# Patient Record
Sex: Male | Born: 1955 | Hispanic: Yes | Marital: Married | Smoking: Never smoker
Health system: Southern US, Community
[De-identification: ages and names within clinical notes are randomized; demographics above are authoritative.]

## PROBLEM LIST (undated history)

## (undated) DIAGNOSIS — K219 Gastro-esophageal reflux disease without esophagitis: Secondary | ICD-10-CM

---

## 2009-03-27 ENCOUNTER — Ambulatory Visit: Payer: Self-pay

## 2009-03-29 ENCOUNTER — Ambulatory Visit: Payer: Self-pay

## 2009-05-18 ENCOUNTER — Encounter: Admission: RE | Admit: 2009-05-18 | Discharge: 2009-05-18 | Payer: Self-pay | Admitting: Surgery

## 2009-05-22 ENCOUNTER — Other Ambulatory Visit: Admission: RE | Admit: 2009-05-22 | Discharge: 2009-05-22 | Payer: Self-pay | Admitting: Otolaryngology

## 2009-06-15 ENCOUNTER — Ambulatory Visit (HOSPITAL_BASED_OUTPATIENT_CLINIC_OR_DEPARTMENT_OTHER): Admission: RE | Admit: 2009-06-15 | Discharge: 2009-06-15 | Payer: Self-pay | Admitting: Otolaryngology

## 2009-06-15 ENCOUNTER — Encounter (INDEPENDENT_AMBULATORY_CARE_PROVIDER_SITE_OTHER): Payer: Self-pay | Admitting: Otolaryngology

## 2010-12-05 LAB — FUNGUS CULTURE W SMEAR

## 2010-12-05 LAB — AFB CULTURE WITH SMEAR (NOT AT ARMC)

## 2010-12-05 LAB — TISSUE CULTURE: Culture: NO GROWTH

## 2018-04-06 ENCOUNTER — Emergency Department (HOSPITAL_COMMUNITY): Payer: PRIVATE HEALTH INSURANCE

## 2018-04-06 ENCOUNTER — Emergency Department (HOSPITAL_COMMUNITY): Payer: PRIVATE HEALTH INSURANCE | Admitting: Anesthesiology

## 2018-04-06 ENCOUNTER — Encounter (HOSPITAL_COMMUNITY)
Admission: EM | Disposition: A | Discharge status: Home / Self Care | Payer: Self-pay | Source: Home / Self Care | Attending: Emergency Medicine

## 2018-04-06 ENCOUNTER — Ambulatory Visit (HOSPITAL_COMMUNITY)
Admission: EM | Admit: 2018-04-06 | Discharge: 2018-04-06 | Disposition: A | Discharge status: Home / Self Care | Payer: PRIVATE HEALTH INSURANCE | Attending: Emergency Medicine | Admitting: Emergency Medicine

## 2018-04-06 ENCOUNTER — Other Ambulatory Visit: Payer: Self-pay

## 2018-04-06 ENCOUNTER — Encounter (HOSPITAL_COMMUNITY): Payer: Self-pay | Admitting: Emergency Medicine

## 2018-04-06 DIAGNOSIS — Y990 Civilian activity done for income or pay: Secondary | ICD-10-CM | POA: Diagnosis not present

## 2018-04-06 DIAGNOSIS — S61301A Unspecified open wound of left index finger with damage to nail, initial encounter: Secondary | ICD-10-CM | POA: Diagnosis present

## 2018-04-06 DIAGNOSIS — Y93D2 Activity, sewing: Secondary | ICD-10-CM | POA: Insufficient documentation

## 2018-04-06 DIAGNOSIS — S62631B Displaced fracture of distal phalanx of left index finger, initial encounter for open fracture: Secondary | ICD-10-CM | POA: Insufficient documentation

## 2018-04-06 DIAGNOSIS — W3189XA Contact with other specified machinery, initial encounter: Secondary | ICD-10-CM | POA: Insufficient documentation

## 2018-04-06 DIAGNOSIS — S61313A Laceration without foreign body of left middle finger with damage to nail, initial encounter: Secondary | ICD-10-CM | POA: Diagnosis not present

## 2018-04-06 DIAGNOSIS — K219 Gastro-esophageal reflux disease without esophagitis: Secondary | ICD-10-CM | POA: Insufficient documentation

## 2018-04-06 DIAGNOSIS — Y9289 Other specified places as the place of occurrence of the external cause: Secondary | ICD-10-CM | POA: Diagnosis not present

## 2018-04-06 DIAGNOSIS — S62633B Displaced fracture of distal phalanx of left middle finger, initial encounter for open fracture: Secondary | ICD-10-CM

## 2018-04-06 HISTORY — PX: I & D EXTREMITY: SHX5045

## 2018-04-06 HISTORY — DX: Gastro-esophageal reflux disease without esophagitis: K21.9

## 2018-04-06 SURGERY — IRRIGATION AND DEBRIDEMENT EXTREMITY
Anesthesia: General | Site: Hand | Laterality: Left

## 2018-04-06 MED ORDER — BUPIVACAINE HCL (PF) 0.25 % IJ SOLN
INTRAMUSCULAR | Status: DC | PRN
  Administered 2018-04-06: 10 mL

## 2018-04-06 MED ORDER — PROPOFOL 10 MG/ML IV BOLUS
INTRAVENOUS | Status: AC
  Filled 2018-04-06: qty 20

## 2018-04-06 MED ORDER — FENTANYL CITRATE (PF) 250 MCG/5ML IJ SOLN
INTRAMUSCULAR | Status: AC
  Filled 2018-04-06: qty 5

## 2018-04-06 MED ORDER — SUCCINYLCHOLINE CHLORIDE 200 MG/10ML IV SOSY
PREFILLED_SYRINGE | INTRAVENOUS | Status: AC
  Filled 2018-04-06: qty 20

## 2018-04-06 MED ORDER — LACTATED RINGERS IV SOLN
INTRAVENOUS | Status: DC
  Administered 2018-04-06: 19:00:00 via INTRAVENOUS

## 2018-04-06 MED ORDER — MIDAZOLAM HCL 2 MG/2ML IJ SOLN
INTRAMUSCULAR | Status: DC | PRN
  Administered 2018-04-06: 2 mg via INTRAVENOUS

## 2018-04-06 MED ORDER — ONDANSETRON HCL 4 MG/2ML IJ SOLN
INTRAMUSCULAR | Status: DC | PRN
  Administered 2018-04-06: 4 mg via INTRAVENOUS

## 2018-04-06 MED ORDER — CEFAZOLIN SODIUM-DEXTROSE 2-4 GM/100ML-% IV SOLN
2.0000 g | Freq: Once | INTRAVENOUS | Status: AC
  Administered 2018-04-06: 2 g via INTRAVENOUS
  Filled 2018-04-06: qty 100

## 2018-04-06 MED ORDER — SUCCINYLCHOLINE 20MG/ML (10ML) SYRINGE FOR MEDFUSION PUMP - OPTIME
INTRAMUSCULAR | Status: DC | PRN
  Administered 2018-04-06: 100 mg via INTRAVENOUS

## 2018-04-06 MED ORDER — HYDROCODONE-ACETAMINOPHEN 5-325 MG PO TABS: ORAL_TABLET | ORAL | 0 refills | Status: DC

## 2018-04-06 MED ORDER — FENTANYL CITRATE (PF) 250 MCG/5ML IJ SOLN
INTRAMUSCULAR | Status: DC | PRN
  Administered 2018-04-06: 100 ug via INTRAVENOUS
  Administered 2018-04-06: 50 ug via INTRAVENOUS

## 2018-04-06 MED ORDER — PROPOFOL 10 MG/ML IV BOLUS
INTRAVENOUS | Status: DC | PRN
  Administered 2018-04-06: 120 mg via INTRAVENOUS

## 2018-04-06 MED ORDER — CEFAZOLIN SODIUM-DEXTROSE 1-4 GM/50ML-% IV SOLN
INTRAVENOUS | Status: AC
  Filled 2018-04-06: qty 50

## 2018-04-06 MED ORDER — MIDAZOLAM HCL 2 MG/2ML IJ SOLN
INTRAMUSCULAR | Status: AC
  Filled 2018-04-06: qty 2

## 2018-04-06 MED ORDER — DEXAMETHASONE SODIUM PHOSPHATE 10 MG/ML IJ SOLN
INTRAMUSCULAR | Status: DC | PRN
  Administered 2018-04-06: 10 mg via INTRAVENOUS

## 2018-04-06 MED ORDER — ONDANSETRON HCL 4 MG/2ML IJ SOLN
INTRAMUSCULAR | Status: AC
  Filled 2018-04-06: qty 2

## 2018-04-06 MED ORDER — LIDOCAINE HCL 2 % IJ SOLN
20.0000 mL | Freq: Once | INTRAMUSCULAR | Status: AC
  Administered 2018-04-06: 400 mg
  Filled 2018-04-06: qty 20

## 2018-04-06 MED ORDER — TETANUS-DIPHTH-ACELL PERTUSSIS 5-2.5-18.5 LF-MCG/0.5 IM SUSP
0.5000 mL | Freq: Once | INTRAMUSCULAR | Status: AC
  Administered 2018-04-06: 0.5 mL via INTRAMUSCULAR
  Filled 2018-04-06: qty 0.5

## 2018-04-06 MED ORDER — LIDOCAINE HCL (CARDIAC) PF 100 MG/5ML IV SOSY
PREFILLED_SYRINGE | INTRAVENOUS | Status: DC | PRN
  Administered 2018-04-06: 60 mg via INTRATRACHEAL

## 2018-04-06 MED ORDER — LIDOCAINE 2% (20 MG/ML) 5 ML SYRINGE
INTRAMUSCULAR | Status: AC
  Filled 2018-04-06: qty 5

## 2018-04-06 MED ORDER — SULFAMETHOXAZOLE-TRIMETHOPRIM 800-160 MG PO TABS: 1.0000 | ORAL_TABLET | Freq: Two times a day (BID) | ORAL | 0 refills | Status: DC

## 2018-04-06 MED ORDER — CEFAZOLIN SODIUM-DEXTROSE 1-4 GM/50ML-% IV SOLN
INTRAVENOUS | Status: DC | PRN
  Administered 2018-04-06: 1 g via INTRAVENOUS

## 2018-04-06 MED ORDER — DEXAMETHASONE SODIUM PHOSPHATE 10 MG/ML IJ SOLN
INTRAMUSCULAR | Status: AC
  Filled 2018-04-06: qty 1

## 2018-04-06 MED ORDER — BUPIVACAINE HCL (PF) 0.25 % IJ SOLN
INTRAMUSCULAR | Status: AC
  Filled 2018-04-06: qty 30

## 2018-04-06 MED ORDER — 0.9 % SODIUM CHLORIDE (POUR BTL) OPTIME
TOPICAL | Status: DC | PRN
  Administered 2018-04-06: 1000 mL

## 2018-04-06 SURGICAL SUPPLY — 47 items
BANDAGE ACE 3X5.8 VEL STRL LF (GAUZE/BANDAGES/DRESSINGS) IMPLANT
BANDAGE ACE 4X5 VEL STRL LF (GAUZE/BANDAGES/DRESSINGS) IMPLANT
BANDAGE COBAN STERILE 2 (GAUZE/BANDAGES/DRESSINGS) IMPLANT
BNDG COHESIVE 1X5 TAN STRL LF (GAUZE/BANDAGES/DRESSINGS) ×2 IMPLANT
BNDG ESMARK 4X9 LF (GAUZE/BANDAGES/DRESSINGS) ×2 IMPLANT
BNDG GAUZE ELAST 4 BULKY (GAUZE/BANDAGES/DRESSINGS) IMPLANT
CORDS BIPOLAR (ELECTRODE) ×2 IMPLANT
COVER SURGICAL LIGHT HANDLE (MISCELLANEOUS) ×2 IMPLANT
CUFF TOURNIQUET SINGLE 18IN (TOURNIQUET CUFF) ×2 IMPLANT
CUFF TOURNIQUET SINGLE 24IN (TOURNIQUET CUFF) IMPLANT
DECANTER SPIKE VIAL GLASS SM (MISCELLANEOUS) ×2 IMPLANT
DRAIN PENROSE 1/4X12 LTX STRL (WOUND CARE) IMPLANT
DRSG PAD ABDOMINAL 8X10 ST (GAUZE/BANDAGES/DRESSINGS) ×4 IMPLANT
GAUZE SPONGE 4X4 12PLY STRL (GAUZE/BANDAGES/DRESSINGS) ×2 IMPLANT
GAUZE XEROFORM 1X8 LF (GAUZE/BANDAGES/DRESSINGS) ×2 IMPLANT
GLOVE BIO SURGEON STRL SZ7.5 (GLOVE) ×2 IMPLANT
GLOVE BIOGEL PI IND STRL 8 (GLOVE) ×1 IMPLANT
GLOVE BIOGEL PI INDICATOR 8 (GLOVE) ×1
GOWN STRL REUS W/ TWL LRG LVL3 (GOWN DISPOSABLE) ×1 IMPLANT
GOWN STRL REUS W/ TWL XL LVL3 (GOWN DISPOSABLE) ×1 IMPLANT
GOWN STRL REUS W/TWL LRG LVL3 (GOWN DISPOSABLE) ×1
GOWN STRL REUS W/TWL XL LVL3 (GOWN DISPOSABLE) ×1
KIT BASIN OR (CUSTOM PROCEDURE TRAY) ×2 IMPLANT
KIT TURNOVER KIT B (KITS) ×2 IMPLANT
LOOP VESSEL MAXI BLUE (MISCELLANEOUS) IMPLANT
MANIFOLD NEPTUNE II (INSTRUMENTS) IMPLANT
NEEDLE HYPO 25X1 1.5 SAFETY (NEEDLE) ×2 IMPLANT
NS IRRIG 1000ML POUR BTL (IV SOLUTION) ×2 IMPLANT
PACK ORTHO EXTREMITY (CUSTOM PROCEDURE TRAY) ×2 IMPLANT
PAD ARMBOARD 7.5X6 YLW CONV (MISCELLANEOUS) ×4 IMPLANT
SCRUB BETADINE 4OZ XXX (MISCELLANEOUS) ×2 IMPLANT
SET CYSTO W/LG BORE CLAMP LF (SET/KITS/TRAYS/PACK) IMPLANT
SOL PREP POV-IOD 4OZ 10% (MISCELLANEOUS) ×2 IMPLANT
SPLINT FINGER (SOFTGOODS) ×4 IMPLANT
SPONGE LAP 4X18 RFD (DISPOSABLE) ×2 IMPLANT
SUT CHROMIC 6 0 PS 4 (SUTURE) ×2 IMPLANT
SUT ETHILON 4 0 P 3 18 (SUTURE) IMPLANT
SUT ETHILON 4 0 PS 2 18 (SUTURE) IMPLANT
SUT MON AB 5-0 P3 18 (SUTURE) ×2 IMPLANT
SWAB COLLECTION DEVICE MRSA (MISCELLANEOUS) IMPLANT
SWAB CULTURE ESWAB REG 1ML (MISCELLANEOUS) IMPLANT
SYR CONTROL 10ML LL (SYRINGE) ×2 IMPLANT
TOWEL OR 17X26 10 PK STRL BLUE (TOWEL DISPOSABLE) ×2 IMPLANT
TUBE CONNECTING 12X1/4 (SUCTIONS) ×2 IMPLANT
TUBE FEEDING ENTERAL 5FR 16IN (TUBING) IMPLANT
UNDERPAD 30X30 (UNDERPADS AND DIAPERS) ×2 IMPLANT
YANKAUER SUCT BULB TIP NO VENT (SUCTIONS) IMPLANT

## 2018-04-06 NOTE — Op Note (Addendum)
NAME: Dylan Black MEDICAL RECORD NO: [id number] DATE OF BIRTH: 09/19/1955 FACILITY: [facility] LOCATION: [facility] OR PHYSICIAN: Tami Ribas, MD   OPERATIVE REPORT   DATE OF PROCEDURE: 04/06/18    PREOPERATIVE DIAGNOSIS:   Left index and long finger lacerations with open long finger distal phalanx laceration and nailbed injury   POSTOPERATIVE DIAGNOSIS:   1.  Left index finger soft tissue amputation 2.  Left long finger open distal phalanx fracture 3.  Left long finger skin and nailbed lacerations   PROCEDURE:   1.  Left long finger open reduction open distal phalanx fracture 2.  Left long finger irrigation and debridement of open distal phalanx fracture 3.  Left long finger repair of skin (~2cm) and nailbed lacerations 4.  Left index finger debridement of wound, 1 cm sq including skin and subcutaneous tissue   SURGEON:  Betha Loa, M.D.   ASSISTANT: none   ANESTHESIA:  General   INTRAVENOUS FLUIDS:  Per anesthesia flow sheet.   ESTIMATED BLOOD LOSS:  Minimal.   COMPLICATIONS:  None.   SPECIMENS:  none   TOURNIQUET TIME:    Total Tourniquet Time Documented: Upper Arm (Left) - 22 minutes Total: Upper Arm (Left) - 22 minutes    DISPOSITION:  Stable to PACU.   INDICATIONS: 62 year old male states he was at work today using a machine when he sustained lacerations to the left index and long fingers.  He was seen in the emergency department where radiograph were taken revealing a long finger distal phalanx fracture.   I recommended operative irrigation debridement and repair of skin and nailbed lacerations.  Risks, benefits and alternatives of surgery were discussed including the risks of blood loss, infection, damage to nerves, vessels, tendons, ligaments, bone for surgery, need for additional surgery, complications with wound healing, continued pain, nonunion, malunion, stiffness.  He voiced understanding of these risks and elected to proceed.  OPERATIVE COURSE:   After being identified preoperatively by myself,  the patient and I agreed on the procedure and site of the procedure.  The surgical site was marked.  Surgical consent had been signed. He was given IV Ancef as preoperative antibiotic prophylaxis. He was transferred to the operating room and placed on the operating table in supine position with the Left upper extremity on an arm board.  General anesthesia was induced by the anesthesiologist.  Left upper extremity was prepped and draped in normal sterile orthopedic fashion.  A surgical pause was performed between the surgeons, anesthesia, and operating room staff and all were in agreement as to the patient, procedure, and site of procedure.  Tourniquet at the proximal aspect of the extremity was inflated to 250 mmHg after exsanguination of the arm with an Esmarch bandage.    The index finger wound was examined.  There was no exposed bone.  There is no gross contamination.  The wound was irrigated and debrided lightly with the sponge.  Bipolar electrocautery was used to obtain hemostasis.  The long finger wound was examined.  Contaminated hematoma was removed.  There was small amount of contamination with a black gritty substance which was removed with the pickups.  The injury was a near amputation with some of the skin and subtenons tissues on the ulnar side intact.  The laceration went through the proximal third of the nail.  There is no repairable neurovascular structure.  The wound was copiously irrigated with sterile saline.  The skin was sharply debrided with the scissors to remove devitalized skin.  The wound was then reapproximated using 5-0 Monocryl suture in the skin and 6-0 chromic suture in the nailbed after removal of the nail with a freer elevator.  Good reapproximation of the soft tissues was obtained.  Digital blocks were performed with quarter percent plain Marcaine to aid in postoperative analgesia.  A piece of Xeroform was placed in the long finger  nail fold.  The wounds were dressed with sterile Xeroform and 4 x 4's and wrapped lightly with Coban dressing.  An AlumaFoam splint was placed on each finger and wrapped lightly with Coban dressing.  The tourniquet was deflated at 22 minutes.  Fingertips were pink with brisk capillary refill after deflation of tourniquet.  The operative  drapes were broken down.  The patient was awoken from anesthesia safely.  He was transferred back to the stretcher and taken to PACU in stable condition.  I will see him back in the office in 1 week for postoperative followup.  I will give him a prescription for Norco 5/325 1-2 tabs PO q6 hours prn pain, dispense # 30 and Bactrim DS 1 p.o. twice daily x7 days.   Tami RibasKUZMA,Anfernee Peschke R, MD Electronically signed, 04/06/18  Addendum (04/09/18): Wound size and depth documentation.

## 2018-04-06 NOTE — Transfer of Care (Signed)
Immediate Anesthesia Transfer of Care Note  Patient: Dylan Black  Procedure(s) Performed: IRRIGATION AND DEBRIDEMENT LEFT INDEX FINGER  AND OPEN REDUCTTION OF FRACTURE OF LEFT MIDDLE FINGER AND REPAIRED SKIN AND NAIL BED (Left Hand)  Patient Location: PACU  Anesthesia Type:General  Level of Consciousness: awake and alert   Airway & Oxygen Therapy: Patient connected to nasal cannula oxygen  Post-op Assessment: Report given to RN and Post -op Vital signs reviewed and stable  Post vital signs: Reviewed and stable  Last Vitals:  Vitals Value Taken Time  BP    Temp    Pulse    Resp    SpO2      Last Pain:  Vitals:   04/06/18 1845  TempSrc:   PainSc: 7          Complications: No apparent anesthesia complications

## 2018-04-06 NOTE — ED Provider Notes (Signed)
Patient placed in Quick Look pathway, seen and evaluated   Chief Complaint: Left hand pain/injury  HPI:   Patient primarily Spanish-speaking, interpreter was used throughout the encounter.  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  He sustained injury to the second and third digits.  EMS applied dressing.  Endorses constant stabbing pain to the distal fingertips, denies numbness.  He is not on blood thinners.  Unsure of tetanus is up-to-date.  ROS: Positive for hand pain Negative for numbness  Physical Exam:   Gen: No distress  Neuro: Awake and Alert  Skin: Warm    Focused Exam: 2+ radial pulses bilaterally.  There is a fingertip avulsion of the distal left second fingertip with partial involvement of the nail.  There is a large circumferential laceration to the distal left third digit which does not involve the nail.  Bleeding is not controlled.   Initiation of care has begun. The patient has been counseled on the process, plan, and necessity for staying for the completion/evaluation, and the remainder of the medical screening examination    Jeanie SewerFawze, Vonette Grosso A, PA-C 04/06/18 1650    Little, Ambrose Finlandachel Morgan, MD 04/07/18 0020

## 2018-04-06 NOTE — ED Triage Notes (Signed)
Pt arrived [facility] from his work where he was using a sewing machine and went over the tip of his index and middle finger on the left hand. VSS with EMS [id number]/94 P66 [id number]. Pt refused pain medication with EMS

## 2018-04-06 NOTE — Anesthesia Procedure Notes (Signed)
Procedure Name: Intubation Date/Time: 04/06/2018 9:26 PM Performed by: Molli HazardGordon, Kathryne Ramella M, CRNA Pre-anesthesia Checklist: Patient identified, Emergency Drugs available and Suction available Patient Re-evaluated:Patient Re-evaluated prior to induction Oxygen Delivery Method: Circle system utilized Preoxygenation: Pre-oxygenation with 100% oxygen Induction Type: IV induction, Rapid sequence and Cricoid Pressure applied Laryngoscope Size: Miller and 2 Grade View: Grade II Tube type: Oral Tube size: 7.5 mm Number of attempts: 1 Airway Equipment and Method: Stylet Placement Confirmation: ETT inserted through vocal cords under direct vision,  positive ETCO2 and breath sounds checked- equal and bilateral Secured at: 22 cm Tube secured with: Tape Dental Injury: Teeth and Oropharynx as per pre-operative assessment

## 2018-04-06 NOTE — H&P (Signed)
Chae Oommen is an 62 y.o. male.   Chief Complaint: left index and long finger lacerations HPI: 62 yo male states he injured left index and long finger in machine at work.  Seen in ED where XR show distal phalanx fracture long finger.  Associated bleeding.  He describes throbbing pain.  Alleviated and aggravated by nothing.  Case discussed with Eyvonne Mechanic, [facility] and his note from 04/06/2018 reviewed. Xrays viewed and interpreted by me: ap, lateral, oblique views left hand show long finger tuft fracture.   Labs reviewed: none  Allergies: No Known Allergies  Past Medical History:  Diagnosis Date  . GERD (gastroesophageal reflux disease)     History reviewed. No pertinent surgical history.  Family History: No family history on file.  Social History:   reports that he has never smoked. He has never used smokeless tobacco. He reports that he does not drink alcohol or use drugs.  Medications: No medications prior to admission.    No results found for this or any previous visit (from the past 48 hour(s)).  Dg Hand Complete Left  Result Date: 04/06/2018 CLINICAL DATA:  Left index and middle finger injury at work. EXAM: LEFT HAND - COMPLETE 3+ VIEW COMPARISON:  None. FINDINGS: Moderately displaced fracture is seen involving the distal portion of the third distal phalanx secondary to laceration injury. No other fracture or dislocation is noted. Laceration is also seen involving the distal tuft soft tissues of the left index finger. Joint spaces are unremarkable. No radiopaque foreign body is noted. IMPRESSION: Moderately displaced fracture is seen involving the distal portion of the third distal phalanx secondary to laceration injury. Laceration is also seen involving the distal soft tissues of the left index finger without definite fracture. Electronically Signed   By: Lupita Raider, M.D.   On: 04/06/2018 17:37     A comprehensive review of systems was negative except for:  Neurological: positive for headaches Review of Systems: No fevers, chills, night sweats, chest pain, shortness of breath, nausea, vomiting, diarrhea, constipation, easy bleeding or bruising, dizziness, vision changes, fainting.   Blood pressure (!) 188/86, pulse 86, temperature 98.1 F (36.7 C), temperature source Oral, resp. rate 18, height 5\' 1"  (1.549 m), weight 63.5 kg (140 lb), SpO2 98 %.  General appearance: alert, cooperative and appears stated age Head: Normocephalic, without obvious abnormality, atraumatic Neck: supple, symmetrical, trachea midline Resp: clear to auscultation bilaterally Cardio: regular rate and rhythm Extremities: right ue: Intact sensation and capillary refill all digits.  +epl/fpl/io.  No wounds. Left ue: Intact sensation and capillary refill all digits except long with decreased sensation distal to laceration.  +epl/fpl/io.  Laceration long finger through proximal nail and onto pad of finger around radial side.  Amputation of distal radial aspect index finger through nail. Pulses: 2+ and symmetric Skin: Skin color, texture, turgor normal. No rashes or lesions Neurologic: Grossly normal Incision/Wound: As above  Assessment/Plan Left index and long finger lacerations with amputation of tissue index finger and fracture and nail injury long finger.  Recommend OR for revision index finger as needed and irrigation and debridement with reduction fracture and repair skin and nail bed in long finger.  Discussed that at this level, neurovascular structures may not be repairable if lacerated and this may lead to long term numbness in tip of finger.  Risks, benefits and alternatives of surgery were discussed including risks of blood loss, infection, damage to nerves/vessels/tendons/ligament/bone, failure of surgery, need for additional surgery, complication with wound healing,  nonunion, malunion, stiffness.  He voiced understanding of these risks and elected to proceed.     Karlea Mckibbin R 04/06/2018, 7:25 PM

## 2018-04-06 NOTE — ED Notes (Signed)
Interpreter services used for translation from spanish to Badenenglish, Quick look provider present for interpretation as well. 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 It was dressed by EMS on scene with gauze bleeding controlled. Dressing removed for assessment. Pt has avulsion to the L index finger and a laceration around the tip of his middle finger. Upon removal of dressing bleeding remains controlled to index finger, middle finger laceration began bleeding with dressing removal. Both digits re-dressed with gauze and gauze wrap at this time. Pt informed of plan of care, verbalized understanding.

## 2018-04-06 NOTE — Progress Notes (Signed)
Went over discharge instruction using an interpreter with pt's wife and son. RN answered all question that they had at discharge, and pre interpreter all answered question where meet to their understanding.

## 2018-04-06 NOTE — Anesthesia Preprocedure Evaluation (Addendum)
Anesthesia Evaluation  Patient identified by MRN, date of birth, ID band Patient awake    Reviewed: Allergy & Precautions, H&P , NPO status , Patient's Chart, lab work & pertinent test results  Airway Mallampati: III  TM Distance: >3 FB Neck ROM: Full    Dental no notable dental hx. (+) Teeth Intact, Dental Advisory Given   Pulmonary neg pulmonary ROS,    Pulmonary exam normal breath sounds clear to auscultation       Cardiovascular negative cardio ROS   Rhythm:Regular Rate:Normal     Neuro/Psych negative neurological ROS  negative psych ROS   GI/Hepatic Neg liver ROS, GERD  Medicated and Controlled,  Endo/Other  negative endocrine ROS  Renal/GU negative Renal ROS  negative genitourinary   Musculoskeletal   Abdominal   Peds  Hematology negative hematology ROS (+)   Anesthesia Other Findings   Reproductive/Obstetrics negative OB ROS                             Anesthesia Physical Anesthesia Plan  ASA: II and emergent  Anesthesia Plan: General   Post-op Pain Management:    Induction: Intravenous, Rapid sequence and Cricoid pressure planned  PONV Risk Score and Plan: 3 and Ondansetron, Dexamethasone and Midazolam  Airway Management Planned: Oral ETT  Additional Equipment:   Intra-op Plan:   Post-operative Plan: Extubation in OR  Informed Consent: I have reviewed the patients History and Physical, chart, labs and discussed the procedure including the risks, benefits and alternatives for the proposed anesthesia with the patient or authorized representative who has indicated his/her understanding and acceptance.   Dental advisory given  Plan Discussed with: CRNA  Anesthesia Plan Comments:         Anesthesia Quick Evaluation

## 2018-04-06 NOTE — Progress Notes (Signed)
ANTIBIOTIC CONSULT NOTE - INITIAL  Pharmacy Consult for: cefazolin Indication: open fracture  No Known Allergies  Patient Measurements: Height: 5\' 1"  (154.9 cm) Weight: 140 lb (63.5 kg) IBW/kg (Calculated) : 52.3  Vital Signs: Temp: 98.1 F (36.7 C) (08/06 1625) Temp Source: Oral (08/06 1625) BP: 188/86 (08/06 1625) Pulse Rate: 86 (08/06 1625) Intake/Output from previous day: No intake/output data recorded. Intake/Output from this shift: No intake/output data recorded.  Labs: No results for input(s): WBC, HGB, PLT, LABCREA, CREATININE in the last 72 hours. CrCl cannot be calculated (No order found.). No results for input(s): VANCOTROUGH, VANCOPEAK, VANCORANDOM, GENTTROUGH, GENTPEAK, GENTRANDOM, TOBRATROUGH, TOBRAPEAK, TOBRARND, AMIKACINPEAK, AMIKACINTROU, AMIKACIN in the last 72 hours.   Microbiology: No results found for this or any previous visit (from the past 720 hour(s)).  Medical History: Past Medical History:  Diagnosis Date  . GERD (gastroesophageal reflux disease)     Assessment: 62 year old male with open fracture to hand. Pharmacy consulted for cefazolin dosing.   Plan:  Cefazolin IV 2 g x 1 dose   Harlow MaresAmy Aidenn Skellenger, PharmD PGY1 Pharmacy Resident Phone [phone]  04/06/2018   5:50 PM

## 2018-04-06 NOTE — Anesthesia Postprocedure Evaluation (Signed)
Anesthesia Post Note  Patient: Dylan Black  Procedure(s) Performed: IRRIGATION AND DEBRIDEMENT LEFT INDEX FINGER  AND OPEN REDUCTTION OF FRACTURE OF LEFT MIDDLE FINGER AND REPAIRED SKIN AND NAIL BED (Left Hand)     Patient location during evaluation: PACU Anesthesia Type: General Level of consciousness: awake and alert Pain management: pain level controlled Vital Signs Assessment: post-procedure vital signs reviewed and stable Respiratory status: spontaneous breathing, nonlabored ventilation and respiratory function stable Cardiovascular status: blood pressure returned to baseline and stable Postop Assessment: no apparent nausea or vomiting Anesthetic complications: no    Last Vitals:  Vitals:   04/06/18 2245 04/06/18 2255  BP:  (!) 156/103  Pulse: 83 75  Resp: 16 13  Temp:  36.6 C  SpO2: 97% 96%    Last Pain:  Vitals:   04/06/18 2255  TempSrc:   PainSc: 0-No pain                 Macklen Wilhoite,W. EDMOND

## 2018-04-06 NOTE — ED Provider Notes (Signed)
[facility] EMERGENCY DEPARTMENT Provider Note   CSN: [id number] Arrival date & time: 04/06/18  1621   History   Chief Complaint Chief Complaint  Patient presents with  . Finger Injury    HPI Dylan Black is a 62 y.o. male.  HPI    62 year old male presents today with complaints of injury to his left hand. Patient notes he is right hand dominant. 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 Patient reports his last by mouth intake was around 2 PM noting he ate bread. Patient denies any surgical history, denies any chronic health conditions or any medications.   Past Medical History:  Diagnosis Date  . GERD (gastroesophageal reflux disease)     There are no active problems to display for this patient.   History reviewed. No pertinent surgical history.      Home Medications    Prior to Admission medications   Not on File    Family History No family history on file.  Social History Social History   Tobacco Use  . Smoking status: Never Smoker  . Smokeless tobacco: Never Used  Substance Use Topics  . Alcohol use: Never    Frequency: Never  . Drug use: Never     Allergies   Patient has no known allergies.   Review of Systems Review of Systems  Skin: Negative for color change and rash.  All other systems reviewed and are negative.  Physical Exam Updated Vital Signs BP (!) 188/86 (BP Location: Right Arm)   Pulse 86   Temp 98.1 F (36.7 C) (Oral)   Resp 18   Ht 5\' 1"  (1.549 m)   Wt 63.5 kg (140 lb)   SpO2 98%   BMI 26.45 kg/m   Physical Exam  Constitutional: He is oriented to person, place, and time. He appears well-developed and well-nourished.  HENT:  Head: Normocephalic and atraumatic.  Eyes: Pupils are equal, round, and reactive to light. Conjunctivae are normal. Right eye exhibits no discharge. Left eye exhibits no discharge.  No scleral icterus.  Neck: Normal range of motion. No JVD present. No tracheal deviation present.  Pulmonary/Chest: Effort normal. No stridor.  Musculoskeletal:  Lacerations noted below, palpable to the bone   Neurological: He is alert and oriented to person, place, and time. Coordination normal.  Psychiatric: He has a normal mood and affect. His behavior is normal. Judgment and thought content normal.  Nursing note and vitals reviewed.             ED Treatments / Results  Labs (all labs ordered are listed, but only abnormal results are displayed) Labs Reviewed - No data to display  EKG None  Radiology Dg Hand Complete Left  Result Date: 04/06/2018 CLINICAL DATA:  Left index and middle finger injury at work. EXAM: LEFT HAND - COMPLETE 3+ VIEW COMPARISON:  None. FINDINGS: Moderately displaced fracture is seen involving the distal portion of the third distal phalanx secondary to laceration injury. No other fracture or dislocation is noted. Laceration is also seen involving the distal tuft soft tissues of the left index finger. Joint spaces are unremarkable. No radiopaque foreign body is noted. IMPRESSION: Moderately displaced fracture is seen involving the distal portion of the third distal phalanx secondary to laceration injury. Laceration is also seen involving the distal soft tissues of the left index finger without definite fracture. Electronically Signed   By:  Lupita RaiderJames  Green Jr, M.D.   On: 04/06/2018 17:37    Procedures .Nerve Block Date/Time: 04/06/2018 9:00 PM Performed by: Eyvonne MechanicHedges, Habib Kise, PA-C Authorized by: Eyvonne MechanicHedges, Reniah Cottingham, PA-C   Consent:    Consent obtained:  Verbal   Risks discussed:  Allergic reaction   Alternatives discussed:  No treatment and alternative treatment Indications:    Indications:  Pain relief Location:    Nerve block body site: Left hand.   Laterality:  Left Pre-procedure details:    Skin preparation:  Hibiclens Skin anesthesia (see MAR for  exact dosages):    Skin anesthesia method:  None Procedure details (see MAR for exact dosages):    Block needle gauge:  25 G   Anesthetic injected:  Lidocaine 2% w/o epi   Injection procedure:  Anatomic landmarks identified, incremental injection, negative aspiration for blood, introduced needle and anatomic landmarks palpated Post-procedure details:    Dressing:  None   Outcome:  Anesthesia achieved   Patient tolerance of procedure:  Tolerated well, no immediate complications   (including critical care time)  Medications Ordered in ED Medications  lactated ringers infusion ( Intravenous Continued from Pre-op 04/06/18 2042)  ceFAZolin (ANCEF) 1-4 GM/50ML-% IVPB (has no administration in time range)  Tdap (BOOSTRIX) injection 0.5 mL (0.5 mLs Intramuscular Given 04/06/18 1703)  lidocaine (XYLOCAINE) 2 % (with pres) injection 400 mg (400 mg Infiltration Given 04/06/18 1705)  ceFAZolin (ANCEF) IVPB 2g/100 mL premix (0 g Intravenous Stopped 04/06/18 1838)     Initial Impression / Assessment and Plan / ED Course  I have reviewed the triage vital signs and the nursing notes.  Pertinent labs & imaging results that were available during my care of the patient were reviewed by me and considered in my medical decision making (see chart for details).     Labs:   Imaging: DG hand complete left  Consults:  Therapeutics: Ancef, lidocaine  Discharge Meds:   Assessment/Plan: 89-year-old male presents today with laceration to his fingers. This appears to be complicated with fracture. Tetanus updated Anaprox given hand surgery consult at 2 take patient to the OR.    Final Clinical Impressions(s) / ED Diagnoses   Final diagnoses:  Open displaced fracture of distal phalanx of left middle finger, initial encounter    ED Discharge Orders    None       Rosalio LoudHedges, Chanoch Mccleery, PA-C 04/06/18 2102    Sabas SousBero, Michael M, MD 04/06/18 2231

## 2018-04-06 NOTE — Discharge Instructions (Signed)

## 2018-04-06 NOTE — ED Notes (Signed)
Report called to OR  

## 2018-04-07 ENCOUNTER — Encounter (HOSPITAL_COMMUNITY): Payer: Self-pay | Admitting: Orthopedic Surgery

## 2018-10-06 ENCOUNTER — Other Ambulatory Visit: Payer: Self-pay

## 2018-10-06 ENCOUNTER — Emergency Department: Payer: Self-pay

## 2018-10-06 ENCOUNTER — Emergency Department
Admission: EM | Admit: 2018-10-06 | Discharge: 2018-10-06 | Disposition: A | Discharge status: Home / Self Care | Payer: Self-pay | Attending: Emergency Medicine | Admitting: Emergency Medicine

## 2018-10-06 DIAGNOSIS — Z79899 Other long term (current) drug therapy: Secondary | ICD-10-CM | POA: Insufficient documentation

## 2018-10-06 DIAGNOSIS — J101 Influenza due to other identified influenza virus with other respiratory manifestations: Secondary | ICD-10-CM

## 2018-10-06 DIAGNOSIS — J209 Acute bronchitis, unspecified: Secondary | ICD-10-CM

## 2018-10-06 LAB — INFLUENZA PANEL BY PCR (TYPE A & B)
Influenza A By PCR: POSITIVE — AB
Influenza B By PCR: NEGATIVE

## 2018-10-06 MED ORDER — IPRATROPIUM-ALBUTEROL 0.5-2.5 (3) MG/3ML IN SOLN
3.0000 mL | Freq: Once | RESPIRATORY_TRACT | Status: AC
  Administered 2018-10-06: 3 mL via RESPIRATORY_TRACT
  Filled 2018-10-06: qty 3

## 2018-10-06 MED ORDER — AZITHROMYCIN 250 MG PO TABS: 250.0000 mg | ORAL_TABLET | Freq: Every day | ORAL | 0 refills | Status: AC

## 2018-10-06 MED ORDER — AZITHROMYCIN 500 MG PO TABS
500.0000 mg | ORAL_TABLET | Freq: Once | ORAL | Status: AC
  Administered 2018-10-06: 500 mg via ORAL
  Filled 2018-10-06: qty 1

## 2018-10-06 MED ORDER — PREDNISONE 10 MG PO TABS: 10.0000 mg | ORAL_TABLET | Freq: Two times a day (BID) | ORAL | 0 refills | Status: DC

## 2018-10-06 MED ORDER — GUAIFENESIN-CODEINE 100-10 MG/5ML PO SOLN
10.0000 mL | Freq: Once | ORAL | Status: AC
  Administered 2018-10-06: 10 mL via ORAL
  Filled 2018-10-06: qty 10

## 2018-10-06 MED ORDER — BENZONATATE 100 MG PO CAPS: ORAL_CAPSULE | ORAL | 0 refills | Status: DC

## 2018-10-06 MED ORDER — ALBUTEROL SULFATE HFA 108 (90 BASE) MCG/ACT IN AERS: 2.0000 | INHALATION_SPRAY | Freq: Four times a day (QID) | RESPIRATORY_TRACT | 0 refills | Status: AC | PRN

## 2018-10-06 NOTE — ED Provider Notes (Signed)
[facility] Emergency Department Provider Note ____________________________________________  Time seen: 1705  I have reviewed the triage vital signs and the nursing notes.  HISTORY  Chief Complaint  Cough  History limited by [place] language.  Interpreter Debarah CrapeClaudia is present during interview and exam.  HPI Dylan Black is a 63 y.o. male presents to the ED accompanied by his wife, for evaluation of persistent cough, intermittent fevers, malaise, decreased appetite.  Patient describes onset of symptoms about 3 to 4 days prior.  He did not receive the seasonal flu vaccine.  He is noted a persistent dry cough with some intermittent phlegm production.  The patient did not receive the seasonal flu vaccine.  He denies any significant chest pain or wheezing, but has noted some mild shortness of breath.  Past Medical History:  Diagnosis Date  . GERD (gastroesophageal reflux disease)     There are no active problems to display for this patient.   Past Surgical History:  Procedure Laterality Date  . I&D EXTREMITY Left 04/06/2018   Procedure: IRRIGATION AND DEBRIDEMENT LEFT INDEX FINGER  AND OPEN REDUCTTION OF FRACTURE OF LEFT MIDDLE FINGER AND REPAIRED SKIN AND NAIL BED;  Surgeon: Betha LoaKuzma, Kevin, MD;  Location: [facility] OR;  Service: Orthopedics;  Laterality: Left;    Prior to Admission medications   Medication Sig Start Date End Date Taking? Authorizing Provider  albuterol (PROVENTIL HFA;VENTOLIN HFA) 108 (90 Base) MCG/ACT inhaler Inhale 2 puffs into the lungs every 6 (six) hours as needed for wheezing or shortness of breath. 10/06/18   Delvecchio Madole, Charlesetta IvoryJenise V Bacon, PA-C  azithromycin (ZITHROMAX Z-PAK) 250 MG tablet Take 1 tablet (250 mg total) by mouth daily for 4 days. Take 2 tablets (500 mg) on  Day 1,  followed by 1 tablet (250 mg) once daily on Days 2 through 5. 10/06/18 10/10/18  Faiga Stones, Charlesetta IvoryJenise V Bacon, PA-C  benzonatate (TESSALON PERLES) 100 MG capsule Take 1-2 tabs TID prn  cough 10/06/18   Zelpha Messing, Charlesetta IvoryJenise V Bacon, PA-C  HYDROcodone-acetaminophen (NORCO) 5-325 MG tablet 1-2 tabs po q6 hours prn pain 04/06/18   Betha LoaKuzma, Kevin, MD  predniSONE (DELTASONE) 10 MG tablet Take 1 tablet (10 mg total) by mouth 2 (two) times daily with a meal. 10/06/18   Karmah Potocki, Charlesetta IvoryJenise V Bacon, PA-C  sulfamethoxazole-trimethoprim (BACTRIM DS) 800-160 MG tablet Take 1 tablet by mouth 2 (two) times daily. 04/06/18   Betha LoaKuzma, Kevin, MD    Allergies Patient has no known allergies.  History reviewed. No pertinent family history.  Social History Social History   Tobacco Use  . Smoking status: Never Smoker  . Smokeless tobacco: Never Used  Substance Use Topics  . Alcohol use: Never    Frequency: Never  . Drug use: Never    Review of Systems  Constitutional: Negative for fever.  Reports chills and malaise. Eyes: Negative for visual changes. ENT: Negative for sore throat. Cardiovascular: Negative for chest pain. Respiratory: Negative for shortness of breath. Reports persistent cough Gastrointestinal: Negative for abdominal pain, vomiting and diarrhea. Genitourinary: Negative for dysuria. Musculoskeletal: Negative for back pain. Skin: Negative for rash. Neurological: Negative for headaches, focal weakness or numbness. ____________________________________________  PHYSICAL EXAM:  VITAL SIGNS: ED Triage Vitals  Enc Vitals Group     BP 10/06/18 1637 (!) 162/90     Pulse Rate 10/06/18 1637 97     Resp 10/06/18 1637 18     Temp 10/06/18 1637 98.7 F (37.1 C)     Temp Source 10/06/18 1637 Oral  SpO2 10/06/18 1637 97 %     Weight 10/06/18 1638 141 lb (64 kg)     Height 10/06/18 1638 5\' 6"  (1.676 m)     Head Circumference --      Peak Flow --      Pain Score 10/06/18 1638 10     Pain Loc --      Pain Edu? --      Excl. in GC? --     Constitutional: Alert and oriented. Well appearing and in no distress. Head: Normocephalic and atraumatic. Eyes: Conjunctivae are normal. PERRL.  Normal extraocular movements Ears: Canals clear. TMs intact bilaterally. Nose: No congestion/rhinorrhea/epistaxis. Mouth/Throat: Mucous membranes are moist.  Uvula is midline and tonsils are flat. Neck: Supple. No thyromegaly. Hematological/Lymphatic/Immunological: No cervical lymphadenopathy. Cardiovascular: Normal rate, regular rhythm. Normal distal pulses. Respiratory: Normal respiratory effort. No wheezes/rales/rhonchi.  Intermittent cough during the interview and exam. Gastrointestinal: Soft and nontender. No distention. ____________________________________________   LABS (pertinent positives/negatives)  Labs Reviewed  INFLUENZA PANEL BY PCR (TYPE A & B) - Abnormal; Notable for the following components:      Result Value   Influenza A By PCR POSITIVE (*)    All other components within normal limits  ____________________________________________   RADIOLOGY  CXR  Negative ____________________________________________  PROCEDURES  Procedures DuoNeb x1 Azithromycin 500 mg p.o. Guaifenesin-codeine solution 10 mL's p.o. ____________________________________________  INITIAL IMPRESSION / ASSESSMENT AND PLAN / ED COURSE  Patient with ED evaluation of 3 to 4-day complaint of cough, congestion, fatigue, and malaise.  Patient clinical picture is concerning for viral etiology and or a acute bacterial infection.  His chest x-ray is read as negative with some chronic scarring as noted.  His influenza screen is positive for influenza A.  Patient is beyond the treatment window for Tamiflu, but will be treated empirically with a azithromycin, Tessalon Perles, prednisone, and albuterol inhaler.  [facility] or return to the ED as needed.  A work note is provided holding him from work for the remainder of this week. ____________________________________________  FINAL CLINICAL IMPRESSION(S) / ED DIAGNOSES  Final diagnoses:  Acute bronchitis,  unspecified organism  Influenza A      Lissa HoardMenshew, Cully Luckow V Bacon, PA-C 10/07/18 0025    Arnaldo NatalMalinda, Paul F, MD 10/12/18 [phone]

## 2018-10-06 NOTE — Discharge Instructions (Addendum)
Your chest x-ray is negative, but your flu test is positive. Take the prescription meds as directed. Take OTC Delsym for additional cough relief. Follow-up with your provider or return as needed.   La radiografa de trax es negativa, pero la prueba de gripe es positiva. Tome los medicamentos recetados segn las instrucciones. Tome OTC Delsym para un alivio adicional de la tos. Tambin puede tomar vitamina C y equincea para aumentar la inmunidad. Seguimiento con su proveedor o regrese segn sea necesario.

## 2018-10-06 NOTE — ED Triage Notes (Signed)
Spanish interpreter used for triage. Pt actively coughing in triage. Strong cough with phelm. Also c/o HA. Has been taking OTC meds.

## 2018-10-06 NOTE — ED Notes (Signed)
E-signature not working at this time. Pt and family verbalized understanding of D/C instructions and follow up care. Pt in NAD at time of D/C.

## 2019-05-24 ENCOUNTER — Emergency Department: Payer: Self-pay

## 2019-05-24 ENCOUNTER — Encounter: Payer: Self-pay | Admitting: Emergency Medicine

## 2019-05-24 ENCOUNTER — Other Ambulatory Visit: Payer: Self-pay

## 2019-05-24 ENCOUNTER — Emergency Department
Admission: EM | Admit: 2019-05-24 | Discharge: 2019-05-24 | Disposition: A | Discharge status: Home / Self Care | Payer: Self-pay | Attending: Emergency Medicine | Admitting: Emergency Medicine

## 2019-05-24 DIAGNOSIS — M545 Low back pain: Secondary | ICD-10-CM | POA: Insufficient documentation

## 2019-05-24 DIAGNOSIS — Z79899 Other long term (current) drug therapy: Secondary | ICD-10-CM | POA: Insufficient documentation

## 2019-05-24 DIAGNOSIS — R079 Chest pain, unspecified: Secondary | ICD-10-CM | POA: Insufficient documentation

## 2019-05-24 DIAGNOSIS — M25511 Pain in right shoulder: Secondary | ICD-10-CM | POA: Insufficient documentation

## 2019-05-24 LAB — CBC WITH DIFFERENTIAL/PLATELET
Abs Immature Granulocytes: 0.02 10*3/uL (ref 0.00–0.07)
Basophils Absolute: 0 10*3/uL (ref 0.0–0.1)
Basophils Relative: 1 %
Eosinophils Absolute: 0.1 10*3/uL (ref 0.0–0.5)
Eosinophils Relative: 2 %
HCT: 44.2 % (ref 39.0–52.0)
Hemoglobin: 14.8 g/dL (ref 13.0–17.0)
Immature Granulocytes: 0 %
Lymphocytes Relative: 33 %
Lymphs Abs: 1.9 10*3/uL (ref 0.7–4.0)
MCH: 29.7 pg (ref 26.0–34.0)
MCHC: 33.5 g/dL (ref 30.0–36.0)
MCV: 88.6 fL (ref 80.0–100.0)
Monocytes Absolute: 0.5 10*3/uL (ref 0.1–1.0)
Monocytes Relative: 10 %
Neutro Abs: 3.1 10*3/uL (ref 1.7–7.7)
Neutrophils Relative %: 54 %
Platelets: 136 10*3/uL — ABNORMAL LOW (ref 150–400)
RBC: 4.99 MIL/uL (ref 4.22–5.81)
RDW: 14 % (ref 11.5–15.5)
WBC: 5.7 10*3/uL (ref 4.0–10.5)
nRBC: 0 % (ref 0.0–0.2)

## 2019-05-24 LAB — COMPREHENSIVE METABOLIC PANEL
ALT: 25 U/L (ref 0–44)
AST: 24 U/L (ref 15–41)
Albumin: 4 g/dL (ref 3.5–5.0)
Alkaline Phosphatase: 97 U/L (ref 38–126)
Anion gap: 6 (ref 5–15)
BUN: 23 mg/dL (ref 8–23)
CO2: 25 mmol/L (ref 22–32)
Calcium: 8.7 mg/dL — ABNORMAL LOW (ref 8.9–10.3)
Chloride: 108 mmol/L (ref 98–111)
Creatinine, Ser: 1.05 mg/dL (ref 0.61–1.24)
GFR calc Af Amer: >60 mL/min (ref >60)
GFR calc non Af Amer: >60 mL/min (ref >60)
Glucose, Bld: 105 mg/dL — ABNORMAL HIGH (ref 70–99)
Potassium: 4.2 mmol/L (ref 3.5–5.1)
Sodium: 139 mmol/L (ref 135–145)
Total Bilirubin: 0.8 mg/dL (ref 0.3–1.2)
Total Protein: 7.1 g/dL (ref 6.5–8.1)

## 2019-05-24 LAB — TROPONIN I (HIGH SENSITIVITY): Troponin I (High Sensitivity): 6 ng/L (ref <18)

## 2019-05-24 MED ORDER — METHOCARBAMOL 500 MG PO TABS: 500.0000 mg | ORAL_TABLET | Freq: Three times a day (TID) | ORAL | 0 refills | Status: AC | PRN

## 2019-05-24 MED ORDER — MELOXICAM 15 MG PO TABS: 15.0000 mg | ORAL_TABLET | Freq: Every day | ORAL | 1 refills | Status: AC
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
